# Patient Record
Sex: Male | Born: 1975 | Race: Black or African American | Hispanic: No | Marital: Single | Smoking: Current every day smoker
Health system: Southern US, Community
[De-identification: ages and names within clinical notes are randomized; demographics above are authoritative.]

## PROBLEM LIST (undated history)

## (undated) DIAGNOSIS — M795 Residual foreign body in soft tissue: Secondary | ICD-10-CM

## (undated) DIAGNOSIS — K649 Unspecified hemorrhoids: Secondary | ICD-10-CM

## (undated) DIAGNOSIS — J449 Chronic obstructive pulmonary disease, unspecified: Secondary | ICD-10-CM

## (undated) HISTORY — PX: REPAIR OF FRACTURED PENIS: SHX6063

---

## 2016-05-02 ENCOUNTER — Emergency Department (HOSPITAL_COMMUNITY)
Admission: EM | Admit: 2016-05-02 | Discharge: 2016-05-02 | Disposition: A | Discharge status: Home / Self Care | Payer: BLUE CROSS/BLUE SHIELD | Attending: Emergency Medicine | Admitting: Emergency Medicine

## 2016-05-02 ENCOUNTER — Encounter (HOSPITAL_COMMUNITY): Payer: Self-pay

## 2016-05-02 ENCOUNTER — Emergency Department (HOSPITAL_COMMUNITY): Payer: BLUE CROSS/BLUE SHIELD

## 2016-05-02 DIAGNOSIS — M5441 Lumbago with sciatica, right side: Secondary | ICD-10-CM | POA: Diagnosis not present

## 2016-05-02 DIAGNOSIS — K625 Hemorrhage of anus and rectum: Secondary | ICD-10-CM | POA: Diagnosis present

## 2016-05-02 DIAGNOSIS — F17210 Nicotine dependence, cigarettes, uncomplicated: Secondary | ICD-10-CM | POA: Diagnosis not present

## 2016-05-02 DIAGNOSIS — K648 Other hemorrhoids: Secondary | ICD-10-CM | POA: Diagnosis not present

## 2016-05-02 HISTORY — DX: Residual foreign body in soft tissue: M79.5

## 2016-05-02 HISTORY — DX: Unspecified hemorrhoids: K64.9

## 2016-05-02 LAB — POC OCCULT BLOOD, ED: FECAL OCCULT BLD: POSITIVE — AB

## 2016-05-02 MED ORDER — HYDROCODONE-ACETAMINOPHEN 5-325 MG PO TABS: 1.0000 | ORAL_TABLET | ORAL | 0 refills | Status: DC | PRN

## 2016-05-02 MED ORDER — HYDROCORTISONE ACETATE 25 MG RE SUPP: 25.0000 mg | Freq: Two times a day (BID) | RECTAL | 0 refills | Status: AC

## 2016-05-02 MED ORDER — CYCLOBENZAPRINE HCL 5 MG PO TABS: 5.0000 mg | ORAL_TABLET | Freq: Three times a day (TID) | ORAL | 0 refills | Status: AC | PRN

## 2016-05-02 MED ORDER — HYDROCODONE-ACETAMINOPHEN 5-325 MG PO TABS
1.0000 | ORAL_TABLET | Freq: Once | ORAL | Status: AC
  Administered 2016-05-02: 1 via ORAL
  Filled 2016-05-02: qty 1

## 2016-05-02 MED ORDER — POLYETHYLENE GLYCOL 3350 17 GM/SCOOP PO POWD
17.0000 g | Freq: Every day | ORAL | 0 refills | Status: AC
Start: 2016-05-02 — End: ?

## 2016-05-02 NOTE — ED Notes (Signed)
Pt alert & oriented x4, stable gait. Patient given discharge instructions, paperwork & prescription(s). Patient informed not to drive, operate any equipment & handel any important documents 4 hours after taking pain medication. Patient  instructed to stop at the registration desk to finish any additional paperwork. Patient  verbalized understanding. Pt left department w/ no further questions. 

## 2016-05-02 NOTE — Discharge Instructions (Signed)
Take the medicines as directed.  Do not drive within 4 hours of taking hydrocodone as this will make you drowsy.  Avoid lifting,  Bending,  Twisting or any other activity that worsens your pain over the next week.  Apply an  icepack  to your lower back for 10-15 minutes every 2 hours for the next 2 days.  You should get rechecked if your symptoms are not improving you develop increased pain,  Weakness in your leg(s) or loss of bladder or bowel function - these can be symptoms of a worsening problem.  Apply the suppository twice daily after soaking in a warm tub of water to help relax the rectal area - this should help reduce the size of the hemorrhoid and may help heal it completely.
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
## 2016-05-14 ENCOUNTER — Encounter: Payer: Self-pay | Admitting: Orthopaedic Surgery

## 2016-05-14 ENCOUNTER — Ambulatory Visit (INDEPENDENT_AMBULATORY_CARE_PROVIDER_SITE_OTHER): Payer: BLUE CROSS/BLUE SHIELD

## 2016-05-14 ENCOUNTER — Ambulatory Visit (INDEPENDENT_AMBULATORY_CARE_PROVIDER_SITE_OTHER): Payer: BLUE CROSS/BLUE SHIELD | Admitting: Orthopaedic Surgery

## 2016-05-14 VITALS — BP 153/94 | HR 87 | Temp 98.2°F | Ht 71.0 in | Wt 159.0 lb

## 2016-05-14 DIAGNOSIS — M5441 Lumbago with sciatica, right side: Secondary | ICD-10-CM

## 2016-05-14 DIAGNOSIS — F17210 Nicotine dependence, cigarettes, uncomplicated: Secondary | ICD-10-CM

## 2016-05-14 DIAGNOSIS — M2170 Unequal limb length (acquired), unspecified site: Secondary | ICD-10-CM

## 2016-05-14 DIAGNOSIS — G8929 Other chronic pain: Secondary | ICD-10-CM

## 2016-05-14 MED ORDER — NAPROXEN 500 MG PO TABS: 500.0000 mg | ORAL_TABLET | Freq: Two times a day (BID) | ORAL | 5 refills | Status: DC

## 2016-05-14 NOTE — Patient Instructions (Signed)
Steps to Quit Smoking Smoking tobacco can be bad for your health. It can also affect almost every organ in your body. Smoking puts you and people around you at risk for many serious long-lasting (chronic) diseases. Quitting smoking is hard, but it is one of the best things that you can do for your health. It is never too late to quit. What are the benefits of quitting smoking? When you quit smoking, you lower your risk for getting serious diseases and conditions. They can include:  Lung cancer or lung disease.  Heart disease.  Stroke.  Heart attack.  Not being able to have children (infertility).  Weak bones (osteoporosis) and broken bones (fractures). If you have coughing, wheezing, and shortness of breath, those symptoms may get better when you quit. You may also get sick less often. If you are pregnant, quitting smoking can help to lower your chances of having a baby of low birth weight. What can I do to help me quit smoking? Talk with your doctor about what can help you quit smoking. Some things you can do (strategies) include:  Quitting smoking totally, instead of slowly cutting back how much you smoke over a period of time.  Going to in-person counseling. You are more likely to quit if you go to many counseling sessions.  Using resources and support systems, such as:  Online chats with a counselor.  Phone quitlines.  Printed self-help materials.  Support groups or group counseling.  Text messaging programs.  Mobile phone apps or applications.  Taking medicines. Some of these medicines may have nicotine in them. If you are pregnant or breastfeeding, do not take any medicines to quit smoking unless your doctor says it is okay. Talk with your doctor about counseling or other things that can help you. Talk with your doctor about using more than one strategy at the same time, such as taking medicines while you are also going to in-person counseling. This can help make quitting  easier. What things can I do to make it easier to quit? Quitting smoking might feel very hard at first, but there is a lot that you can do to make it easier. Take these steps:  Talk to your family and friends. Ask them to support and encourage you.  Call phone quitlines, reach out to support groups, or work with a counselor.  Ask people who smoke to not smoke around you.  Avoid places that make you want (trigger) to smoke, such as:  Bars.  Parties.  Smoke-break areas at work.  Spend time with people who do not smoke.  Lower the stress in your life. Stress can make you want to smoke. Try these things to help your stress:  Getting regular exercise.  Deep-breathing exercises.  Yoga.  Meditating.  Doing a body scan. To do this, close your eyes, focus on one area of your body at a time from head to toe, and notice which parts of your body are tense. Try to relax the muscles in those areas.  Download or buy apps on your mobile phone or tablet that can help you stick to your quit plan. There are many free apps, such as QuitGuide from the CDC (Centers for Disease Control and Prevention). You can find more support from smokefree.gov and other websites. This information is not intended to replace advice given to you by your health care provider. Make sure you discuss any questions you have with your health care provider. Document Released: 01/12/2009 Document Revised: 11/14/2015 Document   Reviewed: 08/02/2014 Elsevier Interactive Patient Education  2017 Elsevier Inc.  
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
## 2016-06-11 ENCOUNTER — Ambulatory Visit: Payer: BLUE CROSS/BLUE SHIELD | Admitting: Orthopaedic Surgery

## 2016-06-18 ENCOUNTER — Encounter: Payer: Self-pay | Admitting: Orthopaedic Surgery

## 2016-06-18 ENCOUNTER — Ambulatory Visit (INDEPENDENT_AMBULATORY_CARE_PROVIDER_SITE_OTHER): Payer: BLUE CROSS/BLUE SHIELD | Admitting: Orthopaedic Surgery

## 2016-06-18 VITALS — BP 118/76 | HR 59 | Temp 97.7°F | Ht 70.0 in | Wt 162.0 lb

## 2016-06-18 DIAGNOSIS — G8929 Other chronic pain: Secondary | ICD-10-CM | POA: Diagnosis not present

## 2016-06-18 DIAGNOSIS — F17210 Nicotine dependence, cigarettes, uncomplicated: Secondary | ICD-10-CM

## 2016-06-18 DIAGNOSIS — M2170 Unequal limb length (acquired), unspecified site: Secondary | ICD-10-CM | POA: Diagnosis not present

## 2016-06-18 DIAGNOSIS — M5441 Lumbago with sciatica, right side: Secondary | ICD-10-CM | POA: Diagnosis not present
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
## 2016-06-18 NOTE — Patient Instructions (Signed)
Smoking Tobacco Information Smoking tobacco will very likely harm your health. Tobacco contains a poisonous (toxic), colorless chemical called nicotine. Nicotine affects the brain and makes tobacco addictive. This change in your brain can make it hard to stop smoking. Tobacco also has other toxic chemicals that can hurt your body and raise your risk of many cancers. How can smoking tobacco affect me? Smoking tobacco can increase your chances of having serious health conditions, such as:  Cancer. Smoking is most commonly associated with lung cancer, but can lead to cancer in other parts of the body.  Chronic obstructive pulmonary disease (COPD). This is a long-term lung condition that makes it hard to breathe. It also gets worse over time.  High blood pressure (hypertension), heart disease, stroke, or heart attack.  Lung infections, such as pneumonia.  Cataracts. This is when the lenses in the eyes become clouded.  Digestive problems. This may include peptic ulcers, heartburn, and gastroesophageal reflux disease (GERD).  Oral health problems, such as gum disease and tooth loss.  Loss of taste and smell. Smoking can affect your appearance by causing:  Wrinkles.  Yellow or stained teeth, fingers, and fingernails. Smoking tobacco can also affect your social life.  Many workplaces, restaurants, hotels, and public places are tobacco-free. This means that you may experience challenges in finding places to smoke when away from home.  The cost of a smoking habit can be expensive. Expenses for someone who smokes come in two ways:  You spend money on a regular basis to buy tobacco.  Your health care costs in the long-term are higher if you smoke.  Tobacco smoke can also affect the health of those around you. Children of smokers have greater chances of:  Sudden infant death syndrome (SIDS).  Ear infections.  Lung infections. What lifestyle changes can be made?  Do not start smoking.  Quit if you already do.  To quit smoking:  Make a plan to quit smoking and commit yourself to it. Look for programs to help you and ask your health care provider for recommendations and ideas.  Talk with your health care provider about using nicotine replacement medicines to help you quit. Medicine replacement medicines include gum, lozenges, patches, sprays, or pills.  Do not replace cigarette smoking with electronic cigarettes, which are commonly called e-cigarettes. The safety of e-cigarettes is not known, and some may contain harmful chemicals.  Avoid places, people, or situations that tempt you to smoke.  If you try to quit but return to smoking, don't give up hope. It is very common for people to try a number of times before they fully succeed. When you feel ready again, give it another try.  Quitting smoking might affect the way you eat as well as your weight. Be prepared to monitor your eating habits. Get support in planning and following a healthy diet.  Ask your health care provider about having regular tests (screenings) to check for cancer. This may include blood tests, imaging tests, and other tests.  Exercise regularly. Consider taking walks, joining a gym, or doing yoga or exercise classes.  Develop skills to manage your stress. These skills include meditation. What are the benefits of quitting smoking? By quitting smoking, you may:  Lower your risk of getting cancer and other diseases caused by smoking.  Live longer.  Breathe better.  Lower your blood pressure and heart rate.  Stop your addiction to tobacco.  Stop creating secondhand smoke that hurts other people.  Improve your sense of taste   and smell.  Look better over time, due to having fewer wrinkles and less staining. What can happen if changes are not made? If you do not stop smoking, you may:  Get cancer and other diseases.  Develop COPD or other long-term (chronic) lung conditions.  Develop  serious problems with your heart and blood vessels (cardiovascular system).  Need more tests to screen for problems caused by smoking.  Have higher, long-term healthcare costs from medicines or treatments related to smoking.  Continue to have worsening changes in your lungs, mouth, and nose. Where to find support: To get support to quit smoking, consider:  Asking your health care provider for more information and resources.  Taking classes to learn more about quitting smoking.  Looking for local organizations that offer resources about quitting smoking.  Joining a support group for people who want to quit smoking in your local community. Where to find more information: You may find more information about quitting smoking from:  HelpGuide.org: www.helpguide.org/articles/addictions/how-to-quit-smoking.htm  Smokefree.gov: smokefree.gov  American Lung Association: www.lung.org Contact a health care provider if:  You have problems breathing.  Your lips, nose, or fingers turn blue.  You have chest pain.  You are coughing up blood.  You feel faint or you pass out.  You have other noticeable changes that cause you to worry. Summary  Smoking tobacco can negatively affect your health, the health of those around you, your finances, and your social life.  Do not start smoking. Quit if you already do. If you need help quitting, ask your health care provider.  Think about joining a support group for people who want to quit smoking in your local community. There are many effective programs that will help you to quit this behavior. This information is not intended to replace advice given to you by your health care provider. Make sure you discuss any questions you have with your health care provider. Document Released: 04/02/2016 Document Revised: 04/02/2016 Document Reviewed: 04/02/2016 Elsevier Interactive Patient Education  2017 [employer].  

## 2016-08-06 ENCOUNTER — Ambulatory Visit: Payer: BLUE CROSS/BLUE SHIELD | Admitting: Orthopaedic Surgery

## 2016-08-13 ENCOUNTER — Ambulatory Visit: Payer: BLUE CROSS/BLUE SHIELD | Admitting: Orthopaedic Surgery

## 2016-09-11 ENCOUNTER — Ambulatory Visit: Payer: BLUE CROSS/BLUE SHIELD | Admitting: Orthopaedic Surgery

## 2018-05-22 IMAGING — DX DG THORACIC SPINE 2V
4 series · 4 of 4 positions shown · non-contrast
Comparison: None.

CLINICAL DATA: Chronic mid to lower back pain, radiating down both
legs. Initial encounter.

EXAM:
THORACIC SPINE 2 VIEWS

[t-spine ap]
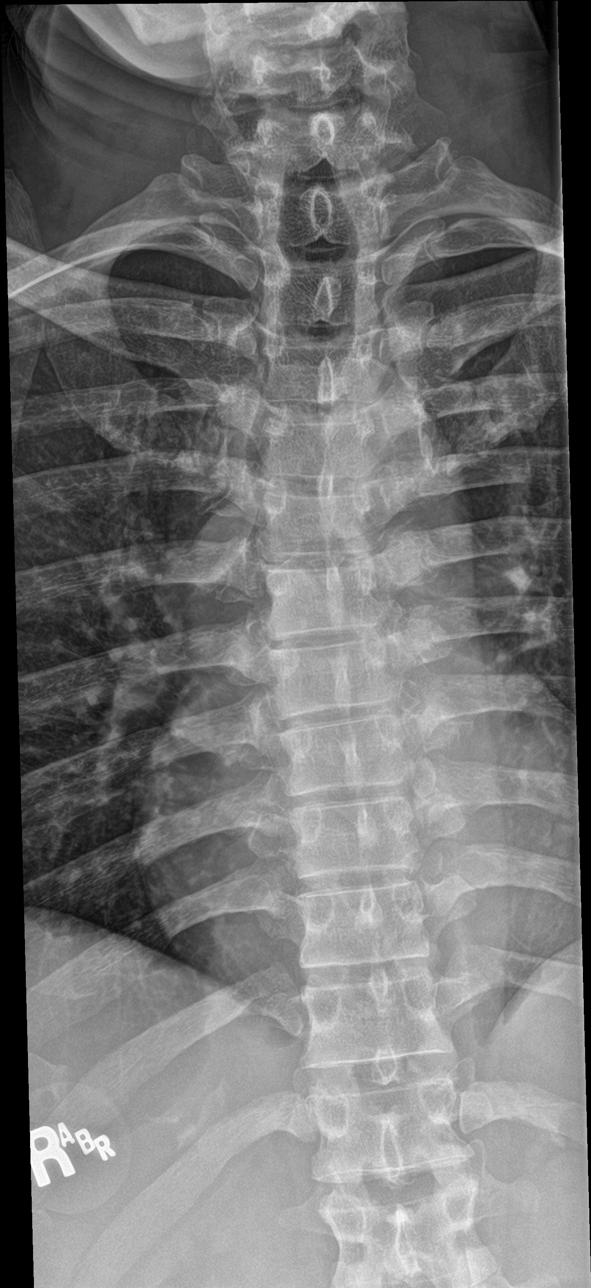

[t-spine lat]
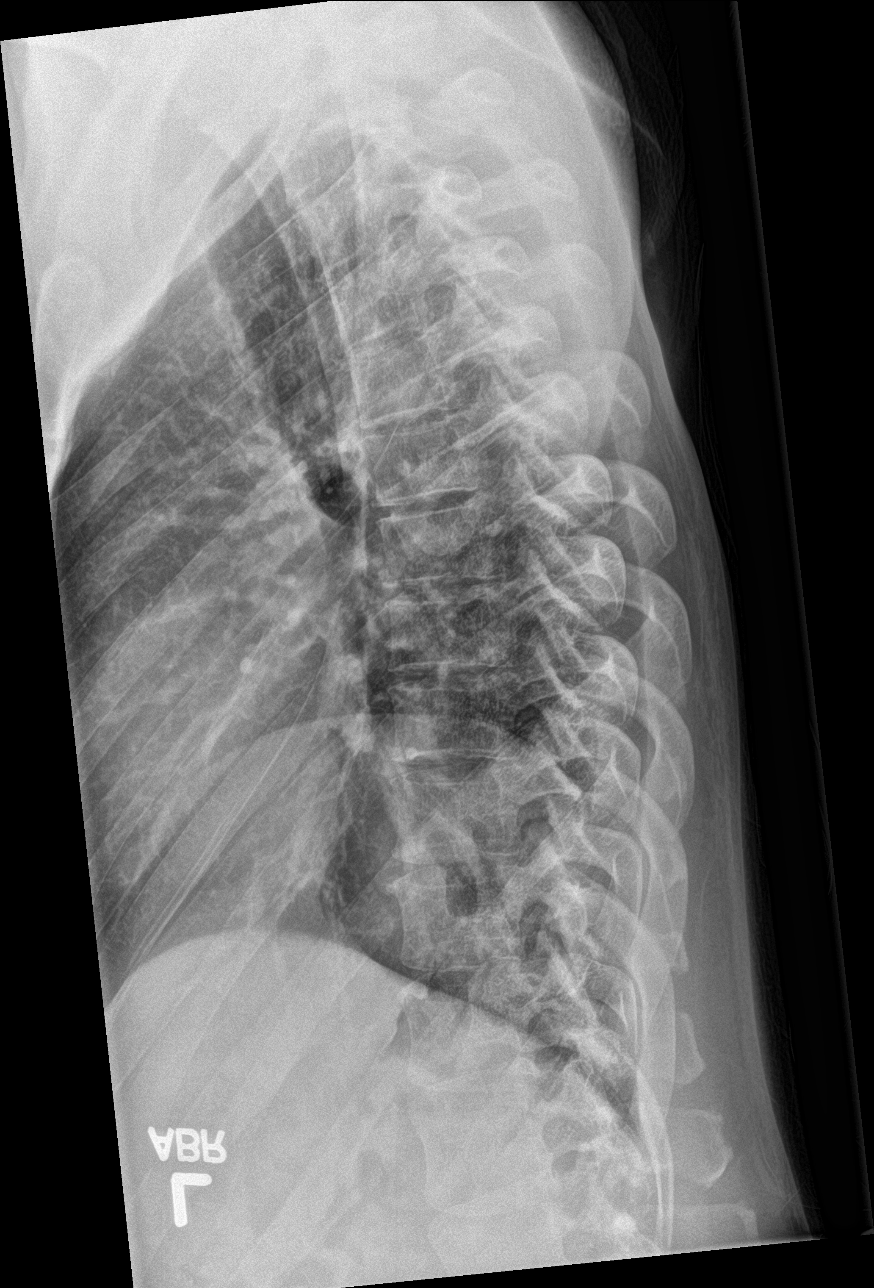

[t-spine swimmers (1 of 2)]
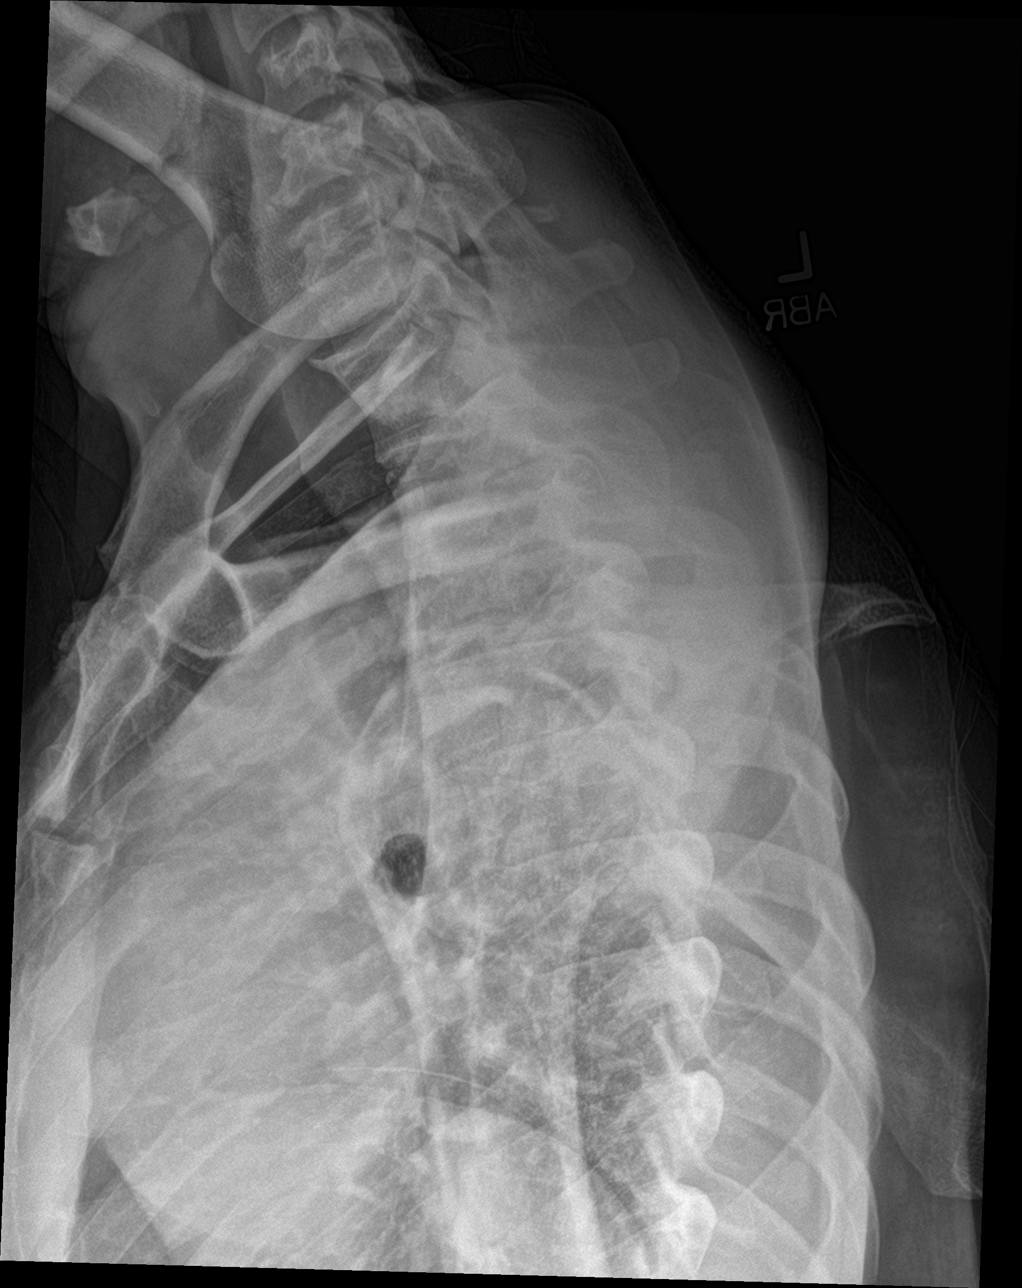

[t-spine swimmers (2 of 2)]
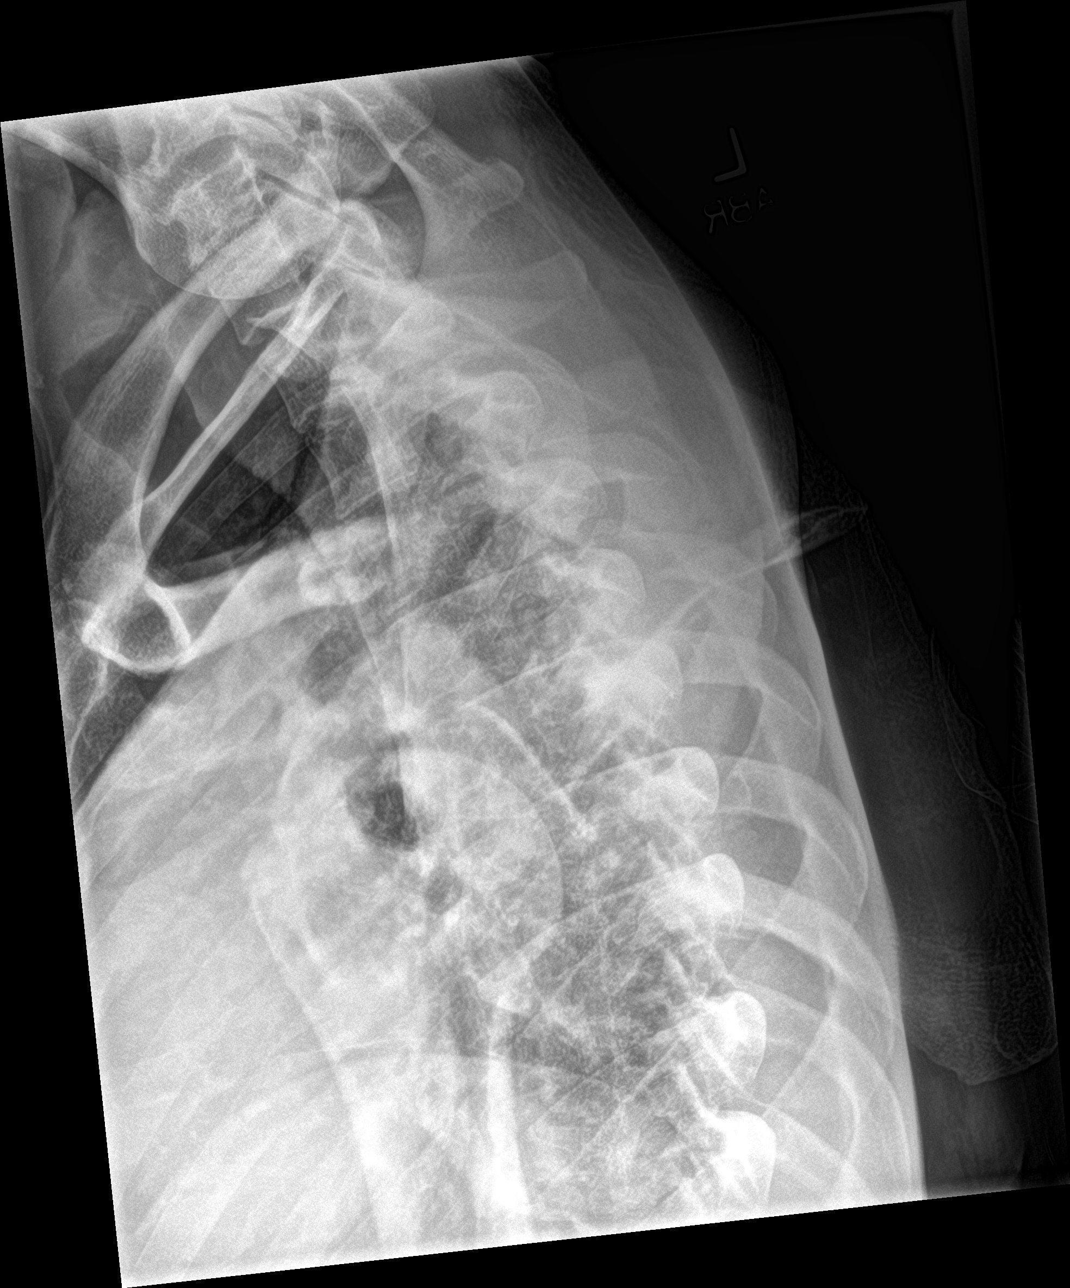

[4 of 4 positions shown; findings below may reference images not displayed]

FINDINGS: There is no evidence of fracture or subluxation. Vertebral bodies
demonstrate normal height and alignment. Intervertebral disc spaces
are preserved. Minimal degenerative change is noted along the lower
cervical spine, with small anterior disc osteophyte complexes.

The visualized portions of both lungs are clear. The mediastinum is
unremarkable in appearance.
IMPRESSION: No evidence of fracture or subluxation along the thoracic spine.
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
## 2022-06-17 ENCOUNTER — Ambulatory Visit: Payer: Self-pay

## 2022-06-18 ENCOUNTER — Ambulatory Visit
Admission: EM | Admit: 2022-06-18 | Discharge: 2022-06-18 | Disposition: A | Discharge status: Home / Self Care | Payer: Self-pay | Attending: Nurse Practitioner | Admitting: Nurse Practitioner

## 2022-06-18 ENCOUNTER — Ambulatory Visit: Payer: Self-pay

## 2022-06-18 DIAGNOSIS — R300 Dysuria: Secondary | ICD-10-CM

## 2022-06-18 DIAGNOSIS — Z113 Encounter for screening for infections with a predominantly sexual mode of transmission: Secondary | ICD-10-CM

## 2022-06-18 LAB — POCT URINALYSIS DIP (MANUAL ENTRY)
Bilirubin, UA: NEGATIVE
Blood, UA: NEGATIVE
Glucose, UA: NEGATIVE mg/dL
Ketones, POC UA: NEGATIVE mg/dL
Leukocytes, UA: NEGATIVE
Nitrite, UA: NEGATIVE
Protein Ur, POC: NEGATIVE mg/dL
Spec Grav, UA: >=1.03 — AB (ref 1.010–1.025)
Urobilinogen, UA: 0.2 E.U./dL
pH, UA: 6.5 (ref 5.0–8.0)

## 2022-06-18 NOTE — Discharge Instructions (Addendum)
The clinic will contact you with results of the testing done today
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
## 2022-06-18 NOTE — ED Triage Notes (Signed)
Pt presents for STD testing and UTI testing.   States he has a penile burning sensation that started 4 days ago.  Denies other sxs.

## 2022-06-19 LAB — CYTOLOGY, (ORAL, ANAL, URETHRAL) ANCILLARY ONLY
Chlamydia: NEGATIVE
Comment: NEGATIVE
Comment: NEGATIVE
Comment: NORMAL
Neisseria Gonorrhea: NEGATIVE
Trichomonas: NEGATIVE

## 2022-12-27 ENCOUNTER — Telehealth: Payer: Self-pay | Admitting: Orthopaedic Surgery

## 2022-12-27 NOTE — Telephone Encounter (Signed)
Received call from patient, he needs copy of his medical records. I emailed auth to him to email on file per his request. He will complete and return to me.

## 2023-02-20 ENCOUNTER — Emergency Department (HOSPITAL_COMMUNITY): Payer: Self-pay

## 2023-02-20 ENCOUNTER — Other Ambulatory Visit: Payer: Self-pay

## 2023-02-20 ENCOUNTER — Encounter (HOSPITAL_COMMUNITY): Payer: Self-pay

## 2023-02-20 ENCOUNTER — Emergency Department (HOSPITAL_COMMUNITY)
Admission: EM | Admit: 2023-02-20 | Discharge: 2023-02-20 | Disposition: A | Discharge status: Home / Self Care | Payer: Self-pay | Attending: Emergency Medicine | Admitting: Emergency Medicine

## 2023-02-20 DIAGNOSIS — J449 Chronic obstructive pulmonary disease, unspecified: Secondary | ICD-10-CM | POA: Insufficient documentation

## 2023-02-20 DIAGNOSIS — R0602 Shortness of breath: Secondary | ICD-10-CM | POA: Insufficient documentation

## 2023-02-20 DIAGNOSIS — R0789 Other chest pain: Secondary | ICD-10-CM | POA: Insufficient documentation

## 2023-02-20 DIAGNOSIS — F1290 Cannabis use, unspecified, uncomplicated: Secondary | ICD-10-CM | POA: Insufficient documentation

## 2023-02-20 HISTORY — DX: Chronic obstructive pulmonary disease, unspecified: J44.9

## 2023-02-20 LAB — BASIC METABOLIC PANEL
Anion gap: 6 (ref 5–15)
BUN: 13 mg/dL (ref 6–20)
CO2: 26 mmol/L (ref 22–32)
Calcium: 9.3 mg/dL (ref 8.9–10.3)
Chloride: 105 mmol/L (ref 98–111)
Creatinine, Ser: 1.19 mg/dL (ref 0.61–1.24)
GFR, Estimated: >60 mL/min (ref >60)
Glucose, Bld: 107 mg/dL — ABNORMAL HIGH (ref 70–99)
Potassium: 3.8 mmol/L (ref 3.5–5.1)
Sodium: 137 mmol/L (ref 135–145)

## 2023-02-20 LAB — TROPONIN I (HIGH SENSITIVITY)
Troponin I (High Sensitivity): 4 ng/L (ref <18)
Troponin I (High Sensitivity): 5 ng/L (ref <18)

## 2023-02-20 LAB — RAPID URINE DRUG SCREEN, HOSP PERFORMED
Amphetamines: NOT DETECTED
Barbiturates: NOT DETECTED
Benzodiazepines: NOT DETECTED
Cocaine: NOT DETECTED
Opiates: NOT DETECTED
Tetrahydrocannabinol: POSITIVE — AB

## 2023-02-20 LAB — CBC
HCT: 39.4 % (ref 39.0–52.0)
Hemoglobin: 13.1 g/dL (ref 13.0–17.0)
MCH: 29.5 pg (ref 26.0–34.0)
MCHC: 33.2 g/dL (ref 30.0–36.0)
MCV: 88.7 fL (ref 80.0–100.0)
Platelets: 201 10*3/uL (ref 150–400)
RBC: 4.44 MIL/uL (ref 4.22–5.81)
RDW: 14 % (ref 11.5–15.5)
WBC: 9.6 10*3/uL (ref 4.0–10.5)
nRBC: 0 % (ref 0.0–0.2)

## 2023-02-20 MED ORDER — IBUPROFEN 200 MG PO TABS
600.0000 mg | ORAL_TABLET | Freq: Once | ORAL | Status: AC
  Administered 2023-02-20: 600 mg via ORAL
  Filled 2023-02-20: qty 1

## 2023-02-20 MED ORDER — OXYCODONE-ACETAMINOPHEN 5-325 MG PO TABS
1.0000 | ORAL_TABLET | Freq: Once | ORAL | Status: AC
  Administered 2023-02-20: 1 via ORAL
  Filled 2023-02-20: qty 1

## 2023-02-20 MED ORDER — HYDROCODONE-ACETAMINOPHEN 5-325 MG PO TABS: 1.0000 | ORAL_TABLET | ORAL | 0 refills | Status: AC | PRN

## 2023-02-20 MED ORDER — IBUPROFEN 800 MG PO TABS: 800.0000 mg | ORAL_TABLET | Freq: Three times a day (TID) | ORAL | 0 refills | Status: AC

## 2023-02-20 NOTE — Discharge Instructions (Addendum)
Thank you for allowing [place] to be a part of your care today.  Your workup is overall reassuring.  Your chest pain may be related to coughing which has strained your chest wall. This will improve with time and medication.  Use your albuterol inhaler as needed for shortness of breath or chest tightness.   With your history of asthma and COPD, smoking of any kind (including marijuana) can make your condition worse and lead to shortness of breath, chest discomfort, and increased coughing.    Take 800 mg of ibuprofen three times daily for chest wall pain and inflammation.  Take the prescribed pain medicine as needed for severe or breakthrough pain.  DO NOT drive, consume alcohol, or perform other tasks that require you to be completely awake and alert while taking hydrocodone as this medication is sedating.   Return to the ED if you develop sudden worsening of your symptoms or if you have new concerns.

## 2023-02-20 NOTE — ED Triage Notes (Signed)
Pt states he is concerned he may have ingested some drugs accidentally while smoking marijuana. Pt states he smoked the marijuana and then about a minute later started having chest pain and shortness of breath. Pt denies nausea and vomiting.

## 2023-02-20 NOTE — ED Provider Notes (Signed)
[place] EMERGENCY DEPARTMENT AT [facility] Provider Note   CSN: [id number] Arrival date & time: 02/20/23  1425     History  Chief Complaint  Patient presents with   Chest Pain    Tommy Ellis is a 47 y.o. male with past medical history significant for COPD presents to the ED with concern that he may have ingested some drugs accidentally while smoking marijuana.  Patient states that he smoked the marijuana and began having chest pain and shortness of breath approximately 1 minute later.  He reports feeling light-headed also.  Patient's chest pain is substernal and to the left.  It is worse with breathing or when he presses on the area.   Denies nausea, vomiting, syncope, palpitations.         Home Medications Prior to Admission medications   Medication Sig Start Date End Date Taking? Authorizing Provider  HYDROcodone-acetaminophen (NORCO/VICODIN) 5-325 MG tablet Take 1 tablet by mouth every 4 (four) hours as needed. 02/20/23  Yes Swanson Farnell R, PA-C  ibuprofen (ADVIL) 800 MG tablet Take 1 tablet (800 mg total) by mouth 3 (three) times daily for 10 days. 02/20/23 03/02/23 Yes Raisa Ditto R, PA-C  cyclobenzaprine (FLEXERIL) 5 MG tablet Take 1 tablet (5 mg total) by mouth 3 (three) times daily as needed for muscle spasms. 05/02/16   Burgess Amor, PA-C  hydrocortisone (ANUSOL-HC) 25 MG suppository Place 1 suppository (25 mg total) rectally 2 (two) times daily. Apply after completing your warm bath soak. 05/02/16   Burgess Amor, PA-C  polyethylene glycol powder (GLYCOLAX/MIRALAX) powder Take 17 g by mouth daily. Per label instructions 05/02/16   Burgess Amor, PA-C      Allergies    Codeine and Strawberry flavor    Review of Systems   Review of Systems  Respiratory:  Positive for cough (chronic) and shortness of breath.   Cardiovascular:  Positive for chest pain. Negative for palpitations.  Gastrointestinal:  Negative for nausea and vomiting.  Neurological:  Positive for  light-headedness. Negative for syncope.    Physical Exam Updated Vital Signs BP 127/79   Pulse (!) 57   Temp 99.5 F (37.5 C)   Resp 15   Ht 5\' 9"  (1.753 m)   Wt 72.6 kg   SpO2 99%   BMI 23.63 kg/m  Physical Exam Vitals and nursing note reviewed.  Constitutional:      General: He is not in acute distress.    Appearance: Normal appearance. He is not ill-appearing or diaphoretic.  Cardiovascular:     Rate and Rhythm: Normal rate and regular rhythm.  Pulmonary:     Effort: Pulmonary effort is normal. No tachypnea, accessory muscle usage or respiratory distress.     Breath sounds: Normal breath sounds and air entry.  Chest:     Chest wall: Tenderness present. No deformity, swelling or crepitus.    Skin:    General: Skin is warm and dry.     Capillary Refill: Capillary refill takes less than 2 seconds.  Neurological:     Mental Status: He is alert. Mental status is at baseline.  Psychiatric:        Mood and Affect: Mood normal.        Behavior: Behavior normal.     ED Results / Procedures / Treatments   Labs (all labs ordered are listed, but only abnormal results are displayed) Labs Reviewed  BASIC METABOLIC PANEL - Abnormal; Notable for the following components:      Result Value  Glucose, Bld 107 (*)    All other components within normal limits  RAPID URINE DRUG SCREEN, HOSP PERFORMED - Abnormal; Notable for the following components:   Tetrahydrocannabinol POSITIVE (*)    All other components within normal limits  CBC  TROPONIN I (HIGH SENSITIVITY)  TROPONIN I (HIGH SENSITIVITY)    EKG EKG Interpretation Date/Time:  Thursday February 20 2023 14:28:15 EST Ventricular Rate:  62 PR Interval:  134 QRS Duration:  100 QT Interval:  400 QTC Calculation: 406 R Axis:   52  Text Interpretation: Normal sinus rhythm with sinus arrhythmia Incomplete right bundle branch block Possible Anterior infarct , age undetermined Abnormal ECG No previous ECGs available  Confirmed by Meridee Score [phone]) on 02/20/2023 4:29:17 PM  Radiology DG Chest 2 View  Result Date: 02/20/2023 CLINICAL DATA:  Chest pain. EXAM: CHEST - 2 VIEW COMPARISON:  None Available. FINDINGS: The heart size and mediastinal contours are within normal limits. Both lungs are clear. The visualized skeletal structures are unremarkable. IMPRESSION: No active cardiopulmonary disease. Electronically Signed   By: Lupita Raider M.D.   On: 02/20/2023 16:55    Procedures Procedures    Medications Ordered in ED Medications  oxyCODONE-acetaminophen (PERCOCET/ROXICET) 5-325 MG per tablet 1 tablet (1 tablet Oral Given 02/20/23 1706)  ibuprofen (ADVIL) tablet 600 mg (600 mg Oral Given 02/20/23 1706)    ED Course/ Medical Decision Making/ A&P                                 Medical Decision Making Amount and/or Complexity of Data Reviewed Labs: ordered. Radiology: ordered.  Risk Prescription drug management.   This patient presents to the ED with chief complaint(s) of chest pain, shortness of breath, light-headedness with pertinent past medical history of marijuana use, COPD, asthma.  The complaint involves an extensive differential diagnosis and also carries with it a high risk of complications and morbidity.    The differential diagnosis includes costochondritis    The initial plan is to obtain chest pain workup, UDS  Initial Assessment:   On exam, patient is resting comfortably in bed and does not appear to be in acute respiratory distress.  Lungs are clear to auscultation bilaterally with adequate tidal volume.  He has increased chest discomfort with deep breathing.  Tenderness to palpation of the sternum, near the xyphoid process, and along the left side anterior ribs.  No crepitus appreciated.  Vital signs are stable.   Independent ECG/labs interpretation:  The following labs were independently interpreted:  UDS only positive for THC.  CBC without abnormality.  Metabolic  panel without electrolyte abnormality.  Troponins negative.  Independent visualization and interpretation of imaging: I independently visualized the following imaging with scope of interpretation limited to determining acute life threatening conditions related to emergency care: chest x-ray, which revealed no acute cardiopulmonary process.   Treatment and Reassessment: Patient given ibuprofen and hydrocodone with improvement in symptoms.  Upon reassessment, he is eating peanut butter and crackers without issue.    Disposition:   Patient's chest pain is atypical and pleuritic.  Low suspicion for ACS as troponins were also negative.  Suspect symptoms may be from coughing episodes causing costochondritis or strain of the chest wall.  Will send patient home on ibuprofen to take TID.  Patient also prescribed short course of pain medicine for severe or breakthrough pain.  Advised patient to use his albuterol inhaler as needed for chest tightness.  The patient has been appropriately medically screened and/or stabilized in the ED. I have low suspicion for any other emergent medical condition which would require further screening, evaluation or treatment in the ED or require inpatient management. At time of discharge the patient is hemodynamically stable and in no acute distress. I have discussed work-up results and diagnosis with patient and answered all questions. Patient is agreeable with discharge plan. We discussed strict return precautions for returning to the emergency department and they verbalized understanding.     Social Determinants of Health:   Patient's  marijuana use   increases the complexity of managing their presentation         Final Clinical Impression(s) / ED Diagnoses Final diagnoses:  Atypical chest pain  Marijuana smoker    Rx / DC Orders ED Discharge Orders          Ordered    HYDROcodone-acetaminophen (NORCO/VICODIN) 5-325 MG tablet  Every 4 hours PRN         02/20/23 1852    ibuprofen (ADVIL) 800 MG tablet  3 times daily        02/20/23 1852              Lenard Simmer, PA-C 02/20/23 2150    Loetta Rough, MD 02/21/23 [phone]
